# Patient Record
Sex: Male | Born: 1980 | Race: Black or African American | Hispanic: No | Marital: Married | Smoking: Never smoker
Health system: Southern US, Community
[De-identification: ages and names within clinical notes are randomized; demographics above are authoritative.]

## PROBLEM LIST (undated history)

## (undated) HISTORY — PX: SCALP LESION REMOVAL W/ FLAP AND SKIN GRAFT: SHX2376

---

## 2002-11-24 ENCOUNTER — Inpatient Hospital Stay (HOSPITAL_COMMUNITY): Admission: EM | Admit: 2002-11-24 | Discharge: 2002-11-25 | Payer: Self-pay | Admitting: Emergency Medicine

## 2002-11-24 ENCOUNTER — Encounter: Payer: Self-pay | Admitting: Emergency Medicine

## 2004-11-01 ENCOUNTER — Emergency Department (HOSPITAL_COMMUNITY): Admission: EM | Admit: 2004-11-01 | Discharge: 2004-11-01 | Payer: Self-pay | Admitting: Emergency Medicine

## 2005-12-05 ENCOUNTER — Emergency Department (HOSPITAL_COMMUNITY): Admission: EM | Admit: 2005-12-05 | Discharge: 2005-12-05 | Payer: Self-pay | Admitting: Family Medicine

## 2006-06-01 ENCOUNTER — Emergency Department (HOSPITAL_COMMUNITY): Admission: EM | Admit: 2006-06-01 | Discharge: 2006-06-01 | Payer: Self-pay | Admitting: Emergency Medicine

## 2006-06-17 ENCOUNTER — Encounter: Admission: RE | Admit: 2006-06-17 | Discharge: 2006-06-17 | Payer: Self-pay | Admitting: Neurological Surgery

## 2006-07-15 ENCOUNTER — Encounter: Admission: RE | Admit: 2006-07-15 | Discharge: 2006-07-15 | Payer: Self-pay | Admitting: Neurological Surgery
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
## 2011-11-08 ENCOUNTER — Encounter (HOSPITAL_COMMUNITY): Payer: Self-pay | Admitting: Emergency Medicine

## 2011-11-08 ENCOUNTER — Emergency Department (HOSPITAL_COMMUNITY)
Admission: EM | Admit: 2011-11-08 | Discharge: 2011-11-09 | Disposition: A | Discharge status: Home / Self Care | Payer: Self-pay | Attending: Emergency Medicine | Admitting: Emergency Medicine

## 2011-11-08 DIAGNOSIS — R109 Unspecified abdominal pain: Secondary | ICD-10-CM | POA: Insufficient documentation

## 2011-11-08 DIAGNOSIS — R10811 Right upper quadrant abdominal tenderness: Secondary | ICD-10-CM | POA: Insufficient documentation

## 2011-11-08 DIAGNOSIS — K219 Gastro-esophageal reflux disease without esophagitis: Secondary | ICD-10-CM | POA: Insufficient documentation

## 2011-11-08 DIAGNOSIS — F1010 Alcohol abuse, uncomplicated: Secondary | ICD-10-CM | POA: Insufficient documentation

## 2011-11-08 NOTE — ED Notes (Signed)
Pt presented to the Er with c/o abd pain, s/s lasting for 3-4 months now, pt states that usually he takes Pepto but no relief noted lately. Denies nausea, vomiting or diarrhea. Pain level reported as 6/10, and constant burning feeling, pt showing the URQ but states mainly in the mid area.

## 2011-11-09 ENCOUNTER — Emergency Department (HOSPITAL_COMMUNITY): Payer: Self-pay

## 2011-11-09 LAB — COMPREHENSIVE METABOLIC PANEL
ALT: 10 U/L (ref 0–53)
Alkaline Phosphatase: 49 U/L (ref 39–117)
CO2: 26 mEq/L (ref 19–32)
GFR calc Af Amer: >90 mL/min (ref >90)
GFR calc non Af Amer: 80 mL/min — ABNORMAL LOW (ref >90)
Glucose, Bld: 89 mg/dL (ref 70–99)
Potassium: 3.9 mEq/L (ref 3.5–5.1)
Sodium: 139 mEq/L (ref 135–145)
Total Protein: 6.9 g/dL (ref 6.0–8.3)

## 2011-11-09 LAB — CBC
Platelets: 201 10*3/uL (ref 150–400)
RBC: 4.9 MIL/uL (ref 4.22–5.81)
WBC: 9.2 10*3/uL (ref 4.0–10.5)

## 2011-11-09 LAB — DIFFERENTIAL
Lymphocytes Relative: 43 % (ref 12–46)
Lymphs Abs: 3.9 10*3/uL (ref 0.7–4.0)
Neutrophils Relative %: 45 % (ref 43–77)

## 2011-11-09 LAB — URINALYSIS, ROUTINE W REFLEX MICROSCOPIC
Bilirubin Urine: NEGATIVE
Hgb urine dipstick: NEGATIVE
Ketones, ur: NEGATIVE mg/dL
Nitrite: NEGATIVE
pH: 6.5 (ref 5.0–8.0)

## 2011-11-09 MED ORDER — SODIUM CHLORIDE 0.9 % IV BOLUS (SEPSIS): 1000.0000 mL | Freq: Once | INTRAVENOUS | Status: DC

## 2011-11-09 MED ORDER — FAMOTIDINE IN NACL 20-0.9 MG/50ML-% IV SOLN
20.0000 mg | Freq: Once | INTRAVENOUS | Status: AC
  Administered 2011-11-09: 20 mg via INTRAVENOUS
  Filled 2011-11-09: qty 50

## 2011-11-09 MED ORDER — SODIUM CHLORIDE 0.9 % IV SOLN
Freq: Once | INTRAVENOUS | Status: AC
  Administered 2011-11-09: 01:00:00 via INTRAVENOUS

## 2011-11-09 MED ORDER — FAMOTIDINE 20 MG PO TABS: 20.0000 mg | ORAL_TABLET | Freq: Two times a day (BID) | ORAL | Status: AC

## 2011-11-09 MED ORDER — ONDANSETRON HCL 4 MG/2ML IJ SOLN: 4.0000 mg | Freq: Once | INTRAMUSCULAR | Status: DC

## 2011-11-09 NOTE — ED Provider Notes (Signed)
Medical screening examination/treatment/procedure(s) were performed by non-physician practitioner and as supervising physician I was immediately available for consultation/collaboration.  Juliet Rude. Rubin Payor, MD 11/09/11 [phone]

## 2011-11-09 NOTE — Discharge Instructions (Signed)
Diet for GERD or PUD Nutrition therapy can help ease the discomfort of gastroesophageal reflux disease (GERD) and peptic ulcer disease (PUD).  HOME CARE INSTRUCTIONS   Eat your meals slowly, in a relaxed setting.   Eat 5 to 6 small meals per day.   If a food causes distress, stop eating it for a period of time.  FOODS TO AVOID  Coffee, regular or decaffeinated.   Cola beverages, regular or low calorie.   Tea, regular or decaffeinated.   Pepper.   Cocoa.   High fat foods, including meats.   Butter, margarine, hydrogenated oil (trans fats).   Peppermint or spearmint (if you have GERD).   Fruits and vegetables if not tolerated.   Alcohol.   Nicotine (smoking or chewing). This is one of the most potent stimulants to acid production in the gastrointestinal tract.   Any food that seems to aggravate your condition.  If you have questions regarding your diet, ask your caregiver or a registered dietitian. TIPS  Lying flat may make symptoms worse. Keep the head of your bed raised 6 to 9 inches (15 to 23 cm) by using a foam wedge or blocks under the legs of the bed.   Do not lay down until 3 hours after eating a meal.   Daily physical activity may help reduce symptoms.  MAKE SURE YOU:   Understand these instructions.   Will watch your condition.   Will get help right away if you are not doing well or get worse.  Document Released: 08/20/2005 Document Revised: 08/09/2011 Document Reviewed: 07/06/2011 Exit[employer] Patient Information 2012 [employer]. 

## 2011-11-09 NOTE — ED Notes (Signed)
Returned from [employer]  Urine sample collected  Pt states continues to have some burning in his stomach and a few sharp pains in his RUQ
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
## 2013-02-09 IMAGING — CR DG CHEST 2V
2 series · 2 of 2 positions shown · non-contrast
Comparison: None

CLINICAL DATA: Chest and right upper abdominal pain

CHEST - 2 VIEW

[w chest pa]
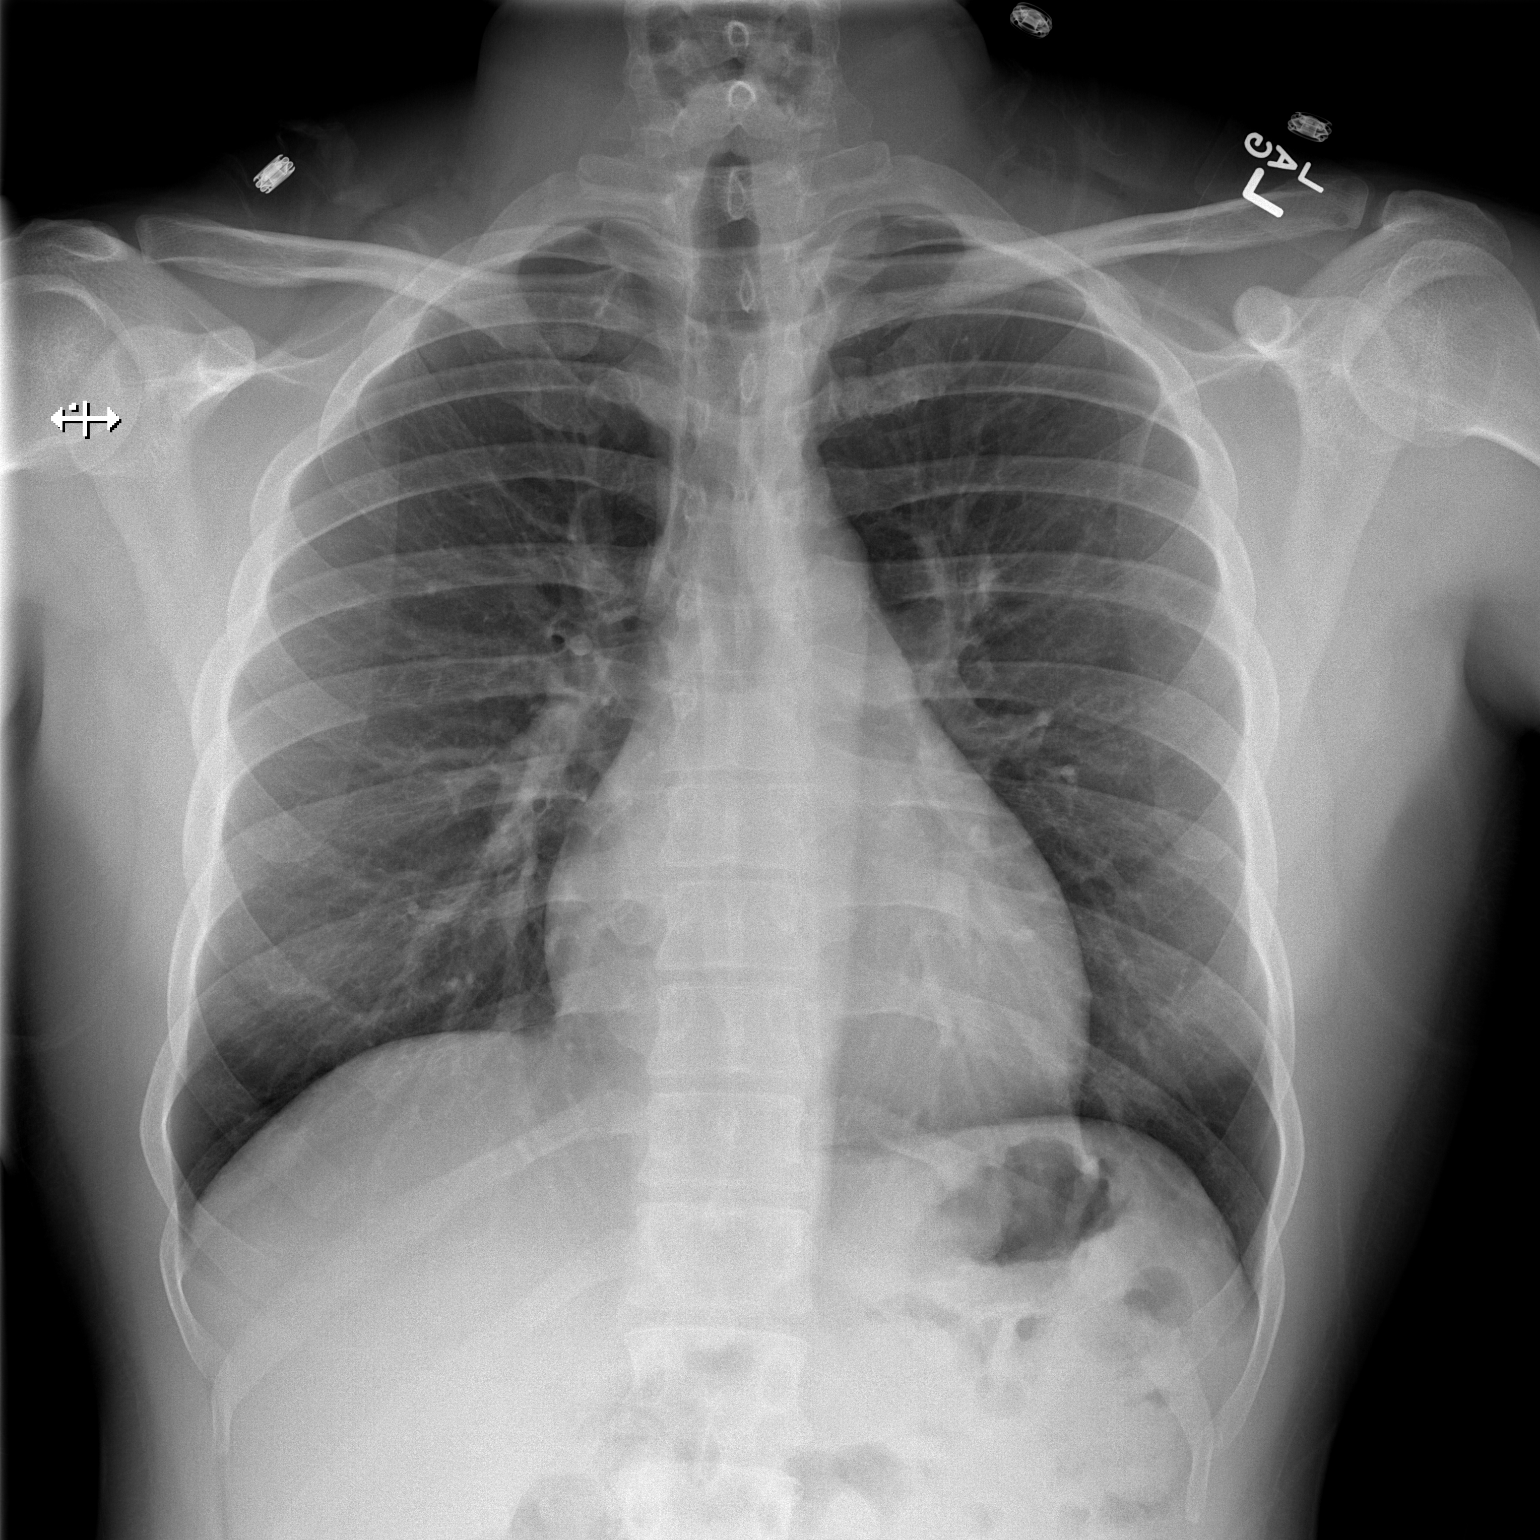

[w chest lat]
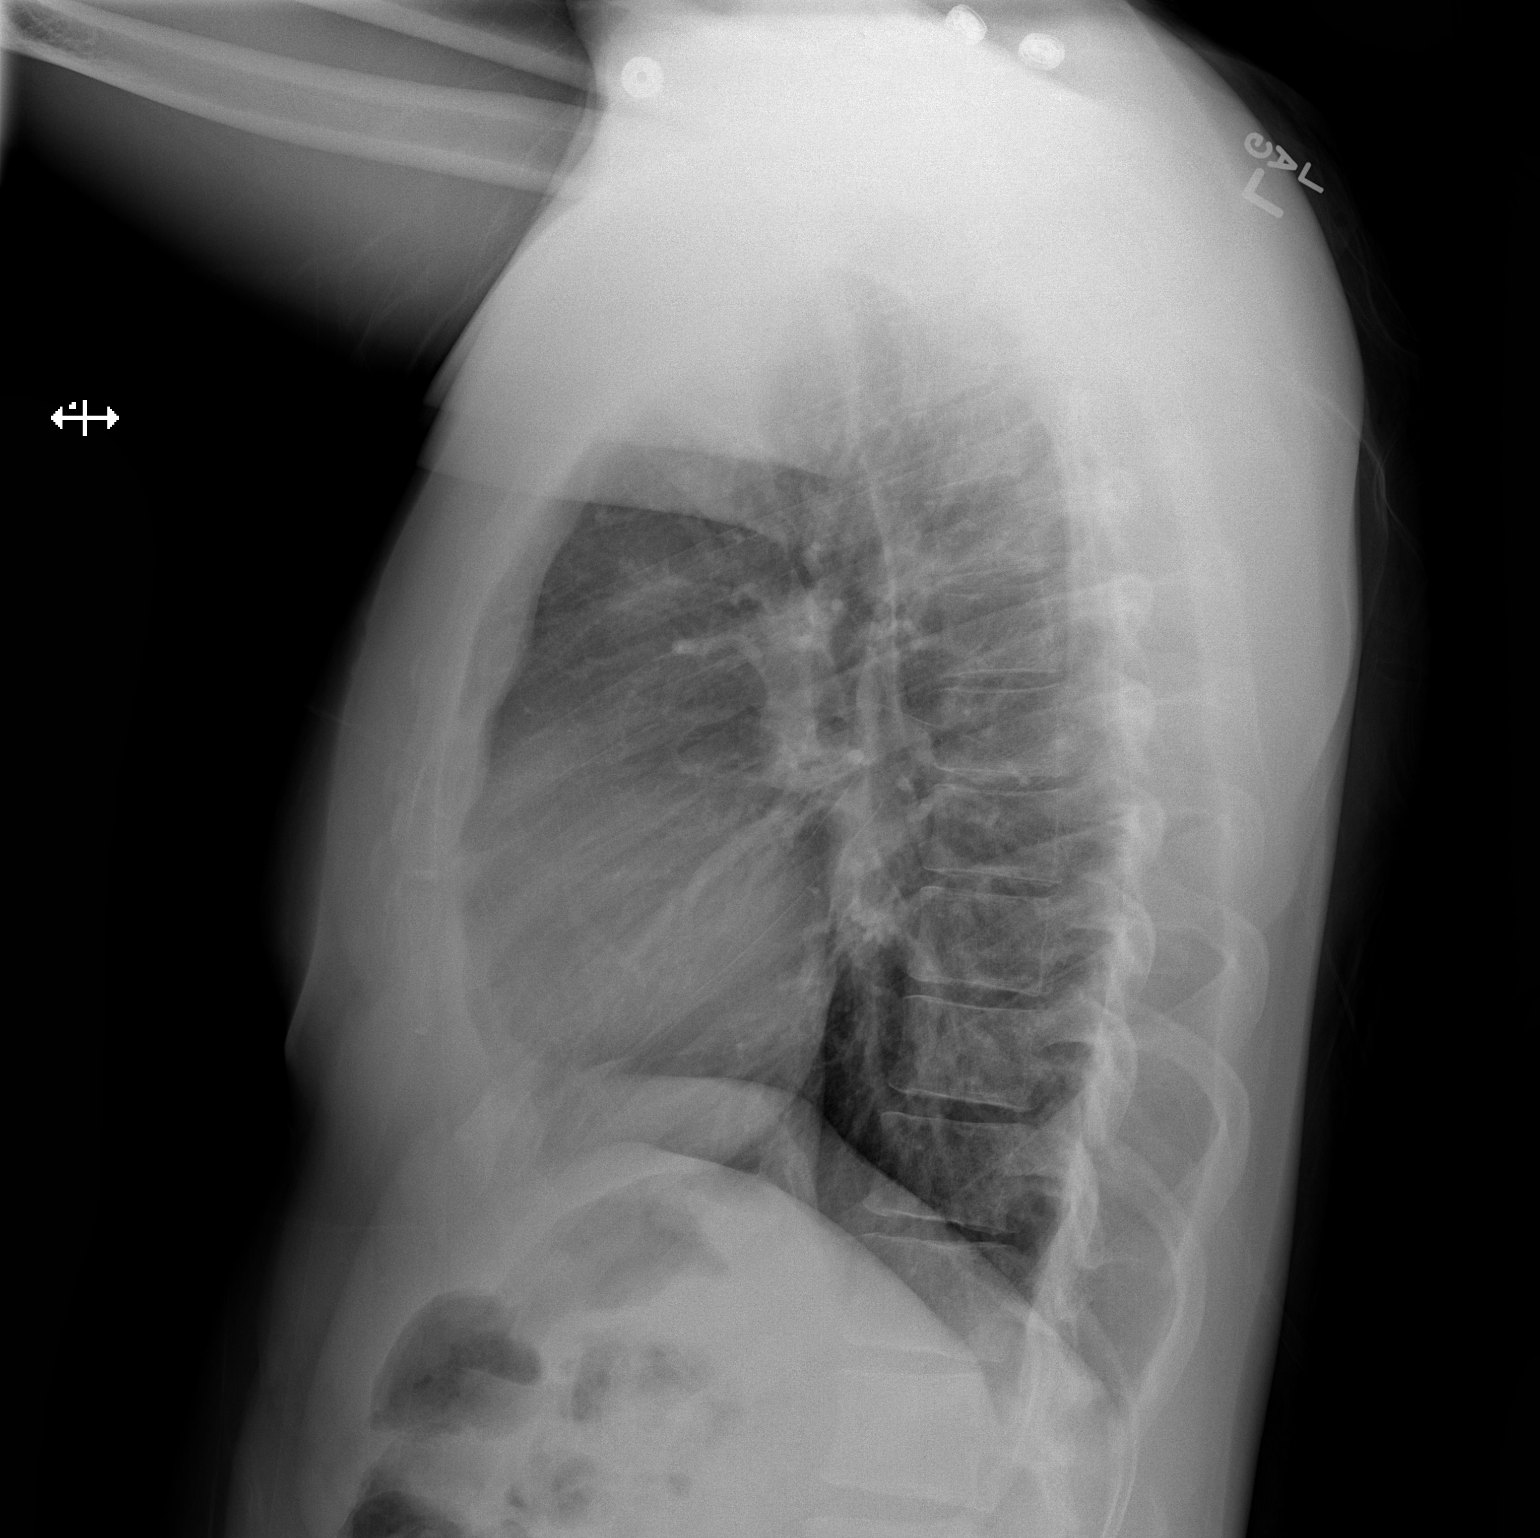

[2 of 2 positions shown; findings below may reference images not displayed]

FINDINGS: Upper-normal size of cardiac silhouette.
Mediastinal contours and pulmonary vascularity normal.
Minimal peribronchial thickening.
Lungs otherwise clear.
No pleural effusion or pneumothorax.
Bones unremarkable.
IMPRESSION: Minimal bronchitic changes.

## 2021-02-09 ENCOUNTER — Ambulatory Visit (INDEPENDENT_AMBULATORY_CARE_PROVIDER_SITE_OTHER): Payer: Self-pay | Admitting: Otolaryngology

## 2021-03-08 ENCOUNTER — Ambulatory Visit (INDEPENDENT_AMBULATORY_CARE_PROVIDER_SITE_OTHER): Payer: Self-pay | Admitting: Otolaryngology

## 2021-03-13 ENCOUNTER — Ambulatory Visit (INDEPENDENT_AMBULATORY_CARE_PROVIDER_SITE_OTHER): Payer: No Typology Code available for payment source | Admitting: Otolaryngology

## 2021-03-13 ENCOUNTER — Other Ambulatory Visit: Payer: Self-pay

## 2021-03-13 DIAGNOSIS — J310 Chronic rhinitis: Secondary | ICD-10-CM | POA: Diagnosis not present

## 2021-03-13 DIAGNOSIS — J341 Cyst and mucocele of nose and nasal sinus: Secondary | ICD-10-CM | POA: Diagnosis not present
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
## 2021-09-07 ENCOUNTER — Encounter (HOSPITAL_COMMUNITY): Payer: Self-pay

## 2021-09-07 ENCOUNTER — Other Ambulatory Visit: Payer: Self-pay

## 2021-09-07 ENCOUNTER — Emergency Department (HOSPITAL_COMMUNITY): Payer: No Typology Code available for payment source

## 2021-09-07 ENCOUNTER — Emergency Department (HOSPITAL_COMMUNITY)
Admission: EM | Admit: 2021-09-07 | Discharge: 2021-09-08 | Disposition: A | Discharge status: Home / Self Care | Payer: No Typology Code available for payment source | Attending: Emergency Medicine | Admitting: Emergency Medicine

## 2021-09-07 DIAGNOSIS — R0602 Shortness of breath: Secondary | ICD-10-CM | POA: Insufficient documentation

## 2021-09-07 DIAGNOSIS — R000 Tachycardia, unspecified: Secondary | ICD-10-CM | POA: Insufficient documentation

## 2021-09-07 DIAGNOSIS — R002 Palpitations: Secondary | ICD-10-CM | POA: Diagnosis present

## 2021-09-07 DIAGNOSIS — R0789 Other chest pain: Secondary | ICD-10-CM | POA: Insufficient documentation

## 2021-09-07 DIAGNOSIS — R001 Bradycardia, unspecified: Secondary | ICD-10-CM | POA: Insufficient documentation

## 2021-09-07 LAB — CBC WITH DIFFERENTIAL/PLATELET
Abs Immature Granulocytes: 0.01 10*3/uL (ref 0.00–0.07)
Basophils Absolute: 0.1 10*3/uL (ref 0.0–0.1)
Basophils Relative: 1 %
Eosinophils Absolute: 0.1 10*3/uL (ref 0.0–0.5)
Eosinophils Relative: 1 %
HCT: 42.8 % (ref 39.0–52.0)
Hemoglobin: 14.1 g/dL (ref 13.0–17.0)
Immature Granulocytes: 0 %
Lymphocytes Relative: 32 %
Lymphs Abs: 1.8 10*3/uL (ref 0.7–4.0)
MCH: 30.3 pg (ref 26.0–34.0)
MCHC: 32.9 g/dL (ref 30.0–36.0)
MCV: 91.8 fL (ref 80.0–100.0)
Monocytes Absolute: 0.4 10*3/uL (ref 0.1–1.0)
Monocytes Relative: 8 %
Neutro Abs: 3.3 10*3/uL (ref 1.7–7.7)
Neutrophils Relative %: 58 %
Platelets: 161 10*3/uL (ref 150–400)
RBC: 4.66 MIL/uL (ref 4.22–5.81)
RDW: 14.4 % (ref 11.5–15.5)
WBC: 5.6 10*3/uL (ref 4.0–10.5)
nRBC: 0 % (ref 0.0–0.2)

## 2021-09-07 LAB — COMPREHENSIVE METABOLIC PANEL
ALT: 11 U/L (ref 0–44)
AST: 23 U/L (ref 15–41)
Albumin: 4.1 g/dL (ref 3.5–5.0)
Alkaline Phosphatase: 41 U/L (ref 38–126)
Anion gap: 6 (ref 5–15)
BUN: 10 mg/dL (ref 6–20)
CO2: 23 mmol/L (ref 22–32)
Calcium: 8.3 mg/dL — ABNORMAL LOW (ref 8.9–10.3)
Chloride: 108 mmol/L (ref 98–111)
Creatinine, Ser: 0.89 mg/dL (ref 0.61–1.24)
GFR, Estimated: >60 mL/min (ref >60)
Glucose, Bld: 91 mg/dL (ref 70–99)
Potassium: 4 mmol/L (ref 3.5–5.1)
Sodium: 137 mmol/L (ref 135–145)
Total Bilirubin: 0.9 mg/dL (ref 0.3–1.2)
Total Protein: 7.2 g/dL (ref 6.5–8.1)

## 2021-09-07 LAB — TROPONIN I (HIGH SENSITIVITY): Troponin I (High Sensitivity): 3 ng/L (ref <18)

## 2021-09-07 NOTE — ED Triage Notes (Addendum)
Pt states he has episodes where he feels like his heart is racing, then sometimes it feels too slow for the past 2 weeks. Pt HR currently 50 in triage. Pt states he does not take an cardiac meds, denies cardiac history. Pt denies chest pain, denies SOB.

## 2021-09-07 NOTE — ED Notes (Signed)
Pt c/o dizziness at this time, HR 46 on the monitor.

## 2021-09-07 NOTE — ED Notes (Addendum)
Enos Fling, PA bedside.

## 2021-09-07 NOTE — ED Provider Triage Note (Signed)
Emergency Medicine Provider Triage Evaluation Note  Jon Ramsey , a 41 y.o. male  was evaluated in triage.  Pt complains of palpitations characterized a pounding sensation.  Patient states he is always had a low heart rate but has been having the symptoms over the last week intermittently.  Nothing seems to make it better or worse.  Some occasional shortness of breath and the sensation to cough but no chest pain.  Review of Systems  Positive:  Negative: See above   Physical Exam  BP 123/85    Pulse (!) 50    Temp 98.5 F (36.9 C)    Resp 18    SpO2 99%  Gen:   Awake, no distress   Resp:  Normal effort  MSK:   Moves extremities without difficulty  Other:  Bradycardic  Medical Decision Making  Medically screening exam initiated at 6:35 PM.  Appropriate orders placed.  Jon Ramsey was informed that the remainder of the evaluation will be completed by another provider, this initial triage assessment does not replace that evaluation, and the importance of remaining in the ED until their evaluation is complete.  I was notified by nursing staff in triage that the patient had a heart rate in the 40s and was dizzy and lightheaded.  I probably came to the treatment room and assessed him.  Patient was bradycardic however his palpitations and dizziness improved.  His heart rate on my evaluation fluctuated from high 40s to low 60s.  I do believe he is in need of emergent care at this time.   Honor Loh [place], [place] 09/07/21 [phone]

## 2021-09-08 LAB — TROPONIN I (HIGH SENSITIVITY): Troponin I (High Sensitivity): 3 ng/L (ref <18)

## 2021-09-08 LAB — TSH: TSH: 0.589 u[IU]/mL (ref 0.350–4.500)

## 2021-09-08 NOTE — Discharge Instructions (Addendum)
All you lab work looks normal today.  Your EKG looks good.  You do have a slow heart rate may be elated to all your working out and being healthy.  However if you start having any chest pain or shortness of breath with activity or passing out with activity you should return to the emergency room immediately.  Discontinue the supplement you have been taking for your heart which may help with your palpitations.  However if the palpitations return and do not go away you should return to the emergency room.

## 2021-09-08 NOTE — ED Provider Notes (Signed)
[facility]-EMERGENCY DEPT Provider Note   CSN: [id number] Arrival date & time: 09/07/21  1756     History  Chief Complaint  Patient presents with   Tachycardia    Bertram DenverReginald D Beyene is a 41 y.o. male.  Patient is a 41 year old male with no known medical problems who is presenting today with complaints of palpitations and intermittently his heart feeling slow.  Patient reports that this has been happening for at least the last 2 weeks but may be a little bit longer.  Patient is very active and reports he works out daily but never seems to have the symptoms while he is working out.  He does not get chest pain or shortness of breath.  Occasionally if he standing or has stood up quickly or has bent over and stood up he will feel a brief episode of being lightheaded but that resolves quickly.  Typically he notices the racing heart rate when he is at rest often times when he is waking up in the morning or it wakes him up in the middle of the night.  He reports it feels very fast and sometimes causes him to have some shortness of breath and some tightness.  It usually does not last more than 1 to 2 minutes.  The pain does not radiate anywhere.  He has not found anything that seems to exacerbate it.  He cannot recall having these issues before or at least not this frequently.  He did 2 months ago start taking a supplement that supposed to be good for the heart and helps with energy but he does not know what it is and it.  He denies drinking any caffeine because it makes him feel jittery.  He does not use tobacco, drugs, marijuana or alcohol.  He is eating and drinking regularly and has not noticed any significant weight loss except that which he has lost from his new regular workout routine.  He has no family history of heart disease or acute cardiac issues.  He denies any recent illness, abdominal pain, nausea or vomiting.  He does endorse increased stress recently in his life but  otherwise currently he is feeling fine.  The history is provided by the patient.      Home Medications Prior to Admission medications   Medication Sig Start Date End Date Taking? Authorizing Provider  BETA GLUCAN PO Take 2 capsules by mouth daily.   Yes [provider]  Cholecalciferol (VITAMIN D3 PO) Take 1 tablet by mouth daily.   Yes [provider]  Probiotic Product (PROBIOTIC PO) Take 1 tablet by mouth daily.   Yes [provider]      Allergies    Patient has no known allergies.    Review of Systems   Review of Systems  Physical Exam Updated Vital Signs BP 112/71    Pulse (!) 40    Temp 98.6 F (37 C) (Oral)    Resp 16    Ht 5\' 8"  (1.727 m)    Wt 79.4 kg    SpO2 100%    BMI 26.61 kg/m  Physical Exam Vitals and nursing note reviewed.  Constitutional:      General: He is not in acute distress.    Appearance: He is well-developed.  HENT:     Head: Normocephalic and atraumatic.     Mouth/Throat:     Mouth: Mucous membranes are moist.  Eyes:     Conjunctiva/sclera: Conjunctivae normal.  Pupils: Pupils are equal, round, and reactive to light.  Cardiovascular:     Rate and Rhythm: Regular rhythm. Bradycardia present.     Pulses: Normal pulses.     Heart sounds: No murmur heard. Pulmonary:     Effort: Pulmonary effort is normal. No respiratory distress.     Breath sounds: Normal breath sounds. No wheezing or rales.  Abdominal:     General: Abdomen is flat. There is no distension.     Palpations: Abdomen is soft.     Tenderness: There is no abdominal tenderness. There is no guarding or rebound.  Musculoskeletal:        General: No tenderness. Normal range of motion.     Cervical back: Normal range of motion and neck supple.     Right lower leg: No edema.     Left lower leg: No edema.  Skin:    General: Skin is warm and dry.     Findings: No erythema or rash.  Neurological:     Mental Status: He is alert and oriented to person,  place, and time. Mental status is at baseline.  Psychiatric:        Mood and Affect: Mood normal.        Behavior: Behavior normal.    ED Results / Procedures / Treatments   Labs (all labs ordered are listed, but only abnormal results are displayed) Labs Reviewed  COMPREHENSIVE METABOLIC PANEL - Abnormal; Notable for the following components:      Result Value   Calcium 8.3 (*)    All other components within normal limits  CBC WITH DIFFERENTIAL/PLATELET  TSH  TROPONIN I (HIGH SENSITIVITY)  TROPONIN I (HIGH SENSITIVITY)    EKG EKG Interpretation  Date/Time:  Thursday September 07 2021 18:32:35 EST Ventricular Rate:  63 PR Interval:  159 QRS Duration: 100 QT Interval:  424 QTC Calculation: 434 R Axis:   -15 Text Interpretation: Sinus or ectopic atrial rhythm Probable left atrial enlargement Borderline left axis deviation No old tracing to compare Confirmed by Meridee Score [phone]) on 09/08/2021 11:45:17 AM  Radiology DG Chest 2 View  Result Date: 09/07/2021 CLINICAL DATA:  Palpitations. EXAM: CHEST - 2 VIEW COMPARISON:  Remote chest radiograph 11/09/2011 FINDINGS: The cardiomediastinal contours are normal. The lungs are clear. Pulmonary vasculature is normal. No consolidation, pleural effusion, or pneumothorax. No acute osseous abnormalities are seen. IMPRESSION: Negative radiographs of the chest. Electronically Signed   By: Narda Rutherford M.D.   On: 09/07/2021 18:57    Procedures Procedures    Medications Ordered in ED Medications - No data to display  ED Course/ Medical Decision Making/ A&P                           Medical Decision Making  Patient is a 41 year old male presenting today with complaints of palpitations and intermittent feeling like his heart rate is slow.  He has no known medical problems.  He does take supplements but no other medications.  He is well-appearing on exam today and he unfortunately has waited almost 24 hours.  Throughout his stay he has  been bradycardic.  Heart rate anywhere between 40-60.  His EKG which I have personally evaluated and interpreted is consistent with sinus bradycardia but no other acute findings.  There are no findings consistent prolonged QT, Brugada's, shortened PR or other acute dysrhythmia.  No evidence of heart block.  Patient never has symptoms with exertion.  He does  have intermittent history of palpitations and concern for may be SVT or possible paroxysmal atrial fibrillation but no sign of that today.  Did recommend that he stop taking the heart supplement in case that has a stimulant that could be causing palpitations.  Low suspicion for other acute abnormalities.  A TSH is wnl but patient's labs were independently evaluated by myself today and showed no evidence of anemia, normal electrolytes and renal function.  Patient is bradycardic here but has sinus bradycardia and feel that this is probably his baseline due to healthy lifestyle and regular activity. I personally evaluated and interpreted the chest x-ray which is normal and agree with radiologist read.  Findings were discussed with the patient and his questions were answered.  He was given follow-up with cardiology if he continues to have symptoms.        Final Clinical Impression(s) / ED Diagnoses Final diagnoses:  Palpitations  Sinus bradycardia    Rx / DC Orders ED Discharge Orders     None         Gwyneth Sprout, MD 09/08/21 1640

## 2022-06-15 ENCOUNTER — Ambulatory Visit (HOSPITAL_BASED_OUTPATIENT_CLINIC_OR_DEPARTMENT_OTHER)
Admission: RE | Admit: 2022-06-15 | Discharge: 2022-06-15 | Disposition: A | Discharge status: Home / Self Care | Payer: No Typology Code available for payment source | Source: Ambulatory Visit | Attending: Urgent Care | Admitting: Urgent Care

## 2022-06-15 ENCOUNTER — Ambulatory Visit
Admission: EM | Admit: 2022-06-15 | Discharge: 2022-06-15 | Disposition: A | Discharge status: Home / Self Care | Payer: No Typology Code available for payment source | Attending: Urgent Care | Admitting: Urgent Care

## 2022-06-15 DIAGNOSIS — M47812 Spondylosis without myelopathy or radiculopathy, cervical region: Secondary | ICD-10-CM | POA: Insufficient documentation

## 2022-06-15 DIAGNOSIS — M542 Cervicalgia: Secondary | ICD-10-CM | POA: Insufficient documentation

## 2022-06-15 DIAGNOSIS — R070 Pain in throat: Secondary | ICD-10-CM | POA: Diagnosis not present

## 2022-06-15 MED ORDER — PSEUDOEPHEDRINE HCL 60 MG PO TABS: 60.0000 mg | ORAL_TABLET | Freq: Three times a day (TID) | ORAL | 0 refills | Status: AC | PRN

## 2022-06-15 MED ORDER — NAPROXEN 500 MG PO TABS: 500.0000 mg | ORAL_TABLET | Freq: Two times a day (BID) | ORAL | 0 refills | Status: AC

## 2022-06-15 MED ORDER — CETIRIZINE HCL 10 MG PO TABS: 10.0000 mg | ORAL_TABLET | Freq: Every day | ORAL | 0 refills | Status: AC

## 2022-06-15 MED ORDER — TIZANIDINE HCL 4 MG PO TABS: 4.0000 mg | ORAL_TABLET | Freq: Every day | ORAL | 0 refills | Status: AC

## 2022-06-15 NOTE — ED Provider Notes (Signed)
[facility]  Note:  This document was prepared using Conservation officer, historic buildings and may include unintentional dictation errors.  MRN: [id number] DOB: 1981-06-04  Subjective:   Jon Ramsey is a 41 y.o. male presenting for 5-day history of persistent neck pain at the base of his neck.  He also has had throat pain, feels like he has a lump in his throat.  Reports that it can be painful to swallow.  He did have a runny and stuffy nose earlier this week but has since resolved.  No fall, trauma, numbness or tingling, saddle paresthesia, changes to bowel or urinary habits, radicular symptoms.  No cough, chest pain, shortness of breath or wheezing.  No fevers.  No neck stiffness, headache.  No rashes.  He does exercise but does not feel that he did anything strenuous before his symptoms started.  No current facility-administered medications for this encounter.  Current Outpatient Medications:    BETA GLUCAN PO, Take 2 capsules by mouth daily., Disp: , Rfl:    Cholecalciferol (VITAMIN D3 PO), Take 1 tablet by mouth daily., Disp: , Rfl:    Probiotic Product (PROBIOTIC PO), Take 1 tablet by mouth daily., Disp: , Rfl:    No Known Allergies  History reviewed. No pertinent past medical history.   Past Surgical History:  Procedure Laterality Date   SCALP LESION REMOVAL W/ FLAP AND SKIN GRAFT     after an  incident    History reviewed. No pertinent family history.  Social History   Tobacco Use   Smoking status: Never    Passive exposure: Never   Smokeless tobacco: Never  Vaping Use   Vaping Use: Never used  Substance Use Topics   Alcohol use: Yes   Drug use: Never    ROS   Objective:   Vitals: BP 123/73 (BP Location: Right Arm)   Pulse (!) 59   Temp 98.8 F (37.1 C) (Oral)   Resp 16   SpO2 96%   Physical Exam Constitutional:      General: He is not in acute distress.    Appearance: Normal appearance. He is well-developed and normal  weight. He is not ill-appearing, toxic-appearing or diaphoretic.  HENT:     Head: Normocephalic and atraumatic.     Right Ear: External ear normal.     Left Ear: External ear normal.     Nose: Nose normal.     Mouth/Throat:     Pharynx: Oropharynx is clear. No pharyngeal swelling, oropharyngeal exudate, posterior oropharyngeal erythema or uvula swelling.     Tonsils: No tonsillar exudate or tonsillar abscesses. 0 on the right. 0 on the left.  Eyes:     General: No scleral icterus.       Right eye: No discharge.        Left eye: No discharge.     Extraocular Movements: Extraocular movements intact.  Neck:     Trachea: No tracheal tenderness or tracheal deviation.     Meningeal: Brudzinski's sign and Kernig's sign absent.  Cardiovascular:     Rate and Rhythm: Normal rate.  Pulmonary:     Effort: Pulmonary effort is normal.  Musculoskeletal:     Cervical back: Normal range of motion. Tenderness (midline started the inferior cervical spinous processes) present. No swelling, edema, deformity, erythema, signs of trauma, lacerations, rigidity, spasms, torticollis, bony tenderness or crepitus. Pain with movement and spinous process tenderness present. No muscular tenderness. Normal range of motion.  Lymphadenopathy:  Cervical: No cervical adenopathy.     Right cervical: No superficial, deep or posterior cervical adenopathy.    Left cervical: No superficial, deep or posterior cervical adenopathy.  Neurological:     Mental Status: He is alert and oriented to person, place, and time.  Psychiatric:        Mood and Affect: Mood normal.        Behavior: Behavior normal.        Thought Content: Thought content normal.        Judgment: Judgment normal.     Assessment and Plan :   PDMP not reviewed this encounter.  1. Neck pain   2. Throat pain     I am pursuing outpatient imaging as we do not have rad tech on site currently.  Recommended medications for supportive care due to the  throat pain, anti-inflammatory muscle relaxant.  Deferred respiratory testing given timeline of the throat pain.  Discussed that differential includes acute pharyngitis, viral pharyngitis, upper respiratory infection, epiglottitis.  For the neck suspect uncomplicated musculoskeletal pain.  No signs of an acute spinal emergency.  Counseled patient on potential for adverse effects with medications prescribed/recommended today, ER and return-to-clinic precautions discussed, patient verbalized understanding.    Jaynee Eagles, PA-C 06/15/22 1818

## 2022-06-15 NOTE — Discharge Instructions (Addendum)
I have placed orders to have an x-ray done at the med center in [facility].  Please had there now, go in through the emergency room to let them know you are in the building for an x-ray. Do not register as an ER patient.  I will call you with your results and update our treatment plan if necessary after I get the report.

## 2022-06-15 NOTE — ED Triage Notes (Signed)
Pt states pain to his the back of his neck/upper back for the past 5 days.
